# Patient Record
Sex: Male | Born: 1995 | Race: Black or African American | Hispanic: No | Marital: Single | Smoking: Never smoker
Health system: Southern US, Community
[De-identification: ages and names within clinical notes are randomized; demographics above are authoritative.]

## PROBLEM LIST (undated history)

## (undated) DIAGNOSIS — G43909 Migraine, unspecified, not intractable, without status migrainosus: Secondary | ICD-10-CM

## (undated) HISTORY — DX: Migraine, unspecified, not intractable, without status migrainosus: G43.909

## (undated) HISTORY — PX: EYE SURGERY: SHX253

---
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
## 2014-04-10 ENCOUNTER — Ambulatory Visit (INDEPENDENT_AMBULATORY_CARE_PROVIDER_SITE_OTHER): Payer: BC Managed Care – PPO | Admitting: Emergency Medicine

## 2014-04-10 VITALS — BP 112/62 | HR 56 | Temp 97.8°F | Resp 16 | Ht 70.75 in | Wt 194.2 lb

## 2014-04-10 DIAGNOSIS — S335XXA Sprain of ligaments of lumbar spine, initial encounter: Secondary | ICD-10-CM

## 2014-04-10 DIAGNOSIS — S39012A Strain of muscle, fascia and tendon of lower back, initial encounter: Secondary | ICD-10-CM

## 2014-04-10 MED ORDER — NAPROXEN SODIUM 550 MG PO TABS: 550.0000 mg | ORAL_TABLET | Freq: Two times a day (BID) | ORAL | Status: AC

## 2014-04-10 NOTE — Patient Instructions (Signed)
Lumbosacral Strain Lumbosacral strain is a strain of any of the parts that make up your lumbosacral vertebrae. Your lumbosacral vertebrae are the bones that make up the lower third of your backbone. Your lumbosacral vertebrae are held together by muscles and tough, fibrous tissue (ligaments).  CAUSES  A sudden blow to your back can cause lumbosacral strain. Also, anything that causes an excessive stretch of the muscles in the low back can cause this strain. This is typically seen when people exert themselves strenuously, fall, lift heavy objects, bend, or crouch repeatedly. RISK FACTORS  Physically demanding work.  Participation in pushing or pulling sports or sports that require sudden twist of the back (tennis, golf, baseball).  Weight lifting.  Excessive lower back curvature.  Forward-tilted pelvis.  Weak back or abdominal muscles or both.  Tight hamstrings. SIGNS AND SYMPTOMS  Lumbosacral strain may cause pain in the area of your injury or pain that moves (radiates) down your leg.  DIAGNOSIS Your health care provider can often diagnose lumbosacral strain through a physical exam. In some cases, you may need tests such as X-ray exams.  TREATMENT  Treatment for your lower back injury depends on many factors that your clinician will have to evaluate. However, most treatment will include the use of anti-inflammatory medicines. HOME CARE INSTRUCTIONS   Avoid hard physical activities (tennis, racquetball, waterskiing) if you are not in proper physical condition for it. This may aggravate or create problems.  If you have a back problem, avoid sports requiring sudden body movements. Swimming and walking are generally safer activities.  Maintain good posture.  Maintain a healthy weight.  For acute conditions, you may put ice on the injured area.  Put ice in a plastic bag.  Place a towel between your skin and the bag.  Leave the ice on for 20 minutes, 2 3 times a day.  When the  low back starts healing, stretching and strengthening exercises may be recommended. SEEK MEDICAL CARE IF:  Your back pain is getting worse.  You experience severe back pain not relieved with medicines. SEEK IMMEDIATE MEDICAL CARE IF:   You have numbness, tingling, weakness, or problems with the use of your arms or legs.  There is a change in bowel or bladder control.  You have increasing pain in any area of the body, including your belly (abdomen).  You notice shortness of breath, dizziness, or feel faint.  You feel sick to your stomach (nauseous), are throwing up (vomiting), or become sweaty.  You notice discoloration of your toes or legs, or your feet get very cold. MAKE SURE YOU:   Understand these instructions.  Will watch your condition.  Will get help right away if you are not doing well or get worse. Document Released: 08/06/2005 Document Revised: 08/17/2013 Document Reviewed: 06/15/2013 ExitCare Patient Information 2014 [employer].  

## 2014-04-10 NOTE — Progress Notes (Signed)
[facility] [address] [place] [address] [phone]  Date:  04/10/2014   Name:  Kenneth Browning   DOB:  Mar 03, 1996   MRN:  [id number]  PCP:  Michiel Sites, MD    Chief Complaint: Back Pain   History of Present Illness:  Kenneth Browning is a 18 y.o. very pleasant male patient who presents with the following:  Recurrent back pain in low back since a fall while playing basketball in middle school.  Says had low back pain last night that forced him to go home from work early. No history of re-injury or overuse.  No medications.  No radiation of pain or neurologic symptoms.  No improvement with over the counter medications or other home remedies. Denies other complaint or health concern today.   There are no active problems to display for this patient.   No past medical history on file.  Past Surgical History  Procedure Laterality Date  . Eye surgery      LT eye surgery    History  Substance Use Topics  . Smoking status: Never Smoker   . Smokeless tobacco: Not on file  . Alcohol Use: No    Family History  Problem Relation Age of Onset  . Cancer Maternal Grandfather     No Known Allergies  Medication list has been reviewed and updated.  No current outpatient prescriptions on file prior to visit.   No current facility-administered medications on file prior to visit.    Review of Systems:  As per HPI, otherwise negative.    Physical Examination: Filed Vitals:   04/10/14 1034  BP: 112/62  Pulse: 56  Temp: 97.8 F (36.6 C)  Resp: 16   Filed Vitals:   04/10/14 1034  Height: 5' 10.75" (1.797 m)  Weight: 194 lb 3.2 oz (88.089 kg)   Body mass index is 27.28 kg/(m^2). Ideal Body Weight: Weight in (lb) to have BMI = 25: 177.6   GEN: WDWN, NAD, Non-toxic, Alert & Oriented x 3 HEENT: Atraumatic, Normocephalic.  Ears and Nose: No external deformity. EXTR: No clubbing/cyanosis/edema NEURO: Normal gait.  PSYCH: Normally  interactive. Conversant. Not depressed or anxious appearing.  Calm demeanor.  Back: mild right lumbar tenderness.  Motor intact.  Neuro intact  Assessment and Plan: Lumbar strain Anaprox   Signed,  Phillips Odor, MD

## 2014-11-30 ENCOUNTER — Ambulatory Visit (INDEPENDENT_AMBULATORY_CARE_PROVIDER_SITE_OTHER): Payer: BLUE CROSS/BLUE SHIELD

## 2014-11-30 ENCOUNTER — Telehealth: Payer: Self-pay | Admitting: Internal Medicine

## 2014-11-30 ENCOUNTER — Ambulatory Visit (INDEPENDENT_AMBULATORY_CARE_PROVIDER_SITE_OTHER): Payer: BLUE CROSS/BLUE SHIELD | Admitting: Family Medicine

## 2014-11-30 VITALS — BP 102/60 | HR 65 | Temp 98.7°F | Resp 18 | Ht 73.5 in | Wt 195.0 lb

## 2014-11-30 DIAGNOSIS — Z23 Encounter for immunization: Secondary | ICD-10-CM

## 2014-11-30 DIAGNOSIS — M25571 Pain in right ankle and joints of right foot: Secondary | ICD-10-CM

## 2014-11-30 DIAGNOSIS — S93401A Sprain of unspecified ligament of right ankle, initial encounter: Secondary | ICD-10-CM

## 2014-11-30 MED ORDER — NAPROXEN 500 MG PO TABS: 500.0000 mg | ORAL_TABLET | Freq: Two times a day (BID) | ORAL | Status: DC

## 2014-11-30 NOTE — Telephone Encounter (Signed)
Left message for the patient to call and reschedule New Patient  appointment.  Thank you, Katrina Webb [facility] [phone] Fax [phone]

## 2014-11-30 NOTE — Progress Notes (Addendum)
Subjective:   This chart was scribed for Ethelda ChickKristi M Smith, MD by Arlan OrganAshley Leger, [facility] Sc[facility]. This patient was seen in room 14 and the patient's care was started 5:49 PM.    Patient ID: Kenneth Browning, male    DOB: [birth year], 19 y.o.   MRN: [id number]  11/30/2014  Ankle Pain and Knee Pain   HPI  HPI Comments: Kenneth Browning is a 19 y.o. male who [facility] complaining of constant, moderate R ankle pain with associated swelling and R knee pain onset 7 PM yesterday evening.  Pain has progressively worsened since yesterday evening. Pt was playing basketball last night when he was hit from behind and felt his ankle roll. Kenneth Browning is able to bear weight to his R foot with pain. Pt presents with a brace to the R foot. No OTC medications taken for pain. He has tried ice application with mild improvement for symptoms. However, he states discomfort worsened in the middle of the night last night and woke him from sleep. 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   Review of Systems  Constitutional: Negative for fever, chills, diaphoresis and fatigue.  Cardiovascular: Negative for leg swelling.  Musculoskeletal: Positive for joint swelling, arthralgias and gait problem.  Neurological: Negative for weakness and numbness.    History reviewed. No pertinent past medical history. Past Surgical History  Procedure Laterality Date   Eye surgery      LT eye surgery   No Known Allergies      Objective:    BP 102/60 mmHg   Pulse 65   Temp(Src) 98.7 F (37.1 C) (Oral)   Resp 18   Ht 6' 1.5" (1.867 m)   Wt 195 lb (88.451 kg)   BMI 25.38 kg/m2   SpO2 100% Physical Exam  Constitutional: He is oriented to person, place, and time. He appears well-developed and well-nourished. No distress.  HENT:  Head: Normocephalic.  Eyes: EOM are normal.  Neck: Normal range of motion.  Pulmonary/Chest: Effort normal.    Abdominal: He exhibits no distension.  Musculoskeletal: He exhibits tenderness.       Right knee: He exhibits bony tenderness. He exhibits normal range of motion, no swelling, no laceration, no erythema and normal meniscus. No tenderness found. No medial joint line, no lateral joint line and no patellar tendon tenderness noted.       Right ankle: He exhibits decreased range of motion and swelling. He exhibits no ecchymosis, no laceration and normal pulse. Tenderness. Lateral malleolus tenderness found. No medial malleolus, no head of 5th metatarsal and no proximal fibula tenderness found. Achilles tendon normal. Achilles tendon exhibits no pain, no defect and normal Thompson's test results.       Right foot: Normal. There is normal range of motion, no tenderness, no bony tenderness and no swelling.  No patellar tenderness on R knee No medial or lateral joint line tenderness No tibial joint line tenderness No pain with calf squeeze Full extension and flexion of R knee Tenderness to palpation on R lateral malleolus with swelling Pain inferior to lateral malleolus  Pain with dorsal flexion less with plantar flexion Anterior drawer test is normal No calf swelling  Neurological: He is alert and oriented to person, place, and time.  Skin: He is not diaphoretic.  Psychiatric: He has a normal mood and affect.  Nursing note and vitals reviewed.  [facility] reading (PRIMARY) by  Dr.  Smith.  R ANKLE:  NAD   INFLUENZA VACCINE ADMINISTERED.    Assessment & Plan:   1. Right ankle pain   2. Need for prophylactic vaccination and inoculation against influenza   3. Right ankle sprain, initial encounter      1. R ankle pain/strain lateral:  New.  Recommend rest, ice, elevation; continue ankle brace with activity for two weeks; no exercise for one week. If no improvement in one week, RTC.   2.  S/p flu vaccine in office.   Meds ordered this encounter  Medications   naproxen (NAPROSYN) 500 MG tablet     Sig: Take 1 tablet (500 mg total) by mouth 2 (two) times daily with a meal.    Dispense:  40 tablet    Refill:  0    No Follow-up on file.   I personally performed the services described in this documentation, which was scribed in my presence. The recorded information has been reviewed and considered.  Kristi Paulita Fujita, M.D. [facility] [address] [place], [place]  [address] [phone] phone [phone] fax

## 2014-11-30 NOTE — Patient Instructions (Signed)

## 2014-12-01 ENCOUNTER — Ambulatory Visit: Payer: Self-pay | Admitting: Physician Assistant

## 2014-12-08 ENCOUNTER — Ambulatory Visit: Payer: Self-pay | Admitting: Physician Assistant

## 2014-12-15 ENCOUNTER — Ambulatory Visit: Payer: Self-pay | Admitting: Physician Assistant

## 2014-12-29 ENCOUNTER — Ambulatory Visit (INDEPENDENT_AMBULATORY_CARE_PROVIDER_SITE_OTHER): Payer: BLUE CROSS/BLUE SHIELD | Admitting: Physician Assistant

## 2014-12-29 ENCOUNTER — Ambulatory Visit: Payer: Self-pay | Admitting: Physician Assistant

## 2014-12-29 ENCOUNTER — Encounter: Payer: Self-pay | Admitting: Physician Assistant

## 2014-12-29 VITALS — BP 120/70 | HR 56 | Temp 98.1°F | Resp 16 | Ht 73.25 in | Wt 195.0 lb

## 2014-12-29 DIAGNOSIS — E559 Vitamin D deficiency, unspecified: Secondary | ICD-10-CM

## 2014-12-29 DIAGNOSIS — R5383 Other fatigue: Secondary | ICD-10-CM

## 2014-12-29 DIAGNOSIS — Z79899 Other long term (current) drug therapy: Secondary | ICD-10-CM

## 2014-12-29 DIAGNOSIS — Z0001 Encounter for general adult medical examination with abnormal findings: Secondary | ICD-10-CM

## 2014-12-29 DIAGNOSIS — R6889 Other general symptoms and signs: Secondary | ICD-10-CM

## 2014-12-29 DIAGNOSIS — G43009 Migraine without aura, not intractable, without status migrainosus: Secondary | ICD-10-CM

## 2014-12-29 DIAGNOSIS — G43909 Migraine, unspecified, not intractable, without status migrainosus: Secondary | ICD-10-CM | POA: Insufficient documentation

## 2014-12-29 LAB — CBC WITH DIFFERENTIAL/PLATELET
BASOS PCT: 2 % — AB (ref 0–1)
Basophils Absolute: 0.1 10*3/uL (ref 0.0–0.1)
EOS ABS: 0.2 10*3/uL (ref 0.0–0.7)
EOS PCT: 6 % — AB (ref 0–5)
HEMATOCRIT: 41.3 % (ref 39.0–52.0)
HEMOGLOBIN: 14.4 g/dL (ref 13.0–17.0)
LYMPHS ABS: 1.5 10*3/uL (ref 0.7–4.0)
Lymphocytes Relative: 44 % (ref 12–46)
MCH: 28.9 pg (ref 26.0–34.0)
MCHC: 34.9 g/dL (ref 30.0–36.0)
MCV: 82.9 fL (ref 78.0–100.0)
MONO ABS: 0.5 10*3/uL (ref 0.1–1.0)
MONOS PCT: 13 % — AB (ref 3–12)
MPV: 9.9 fL (ref 8.6–12.4)
Neutro Abs: 1.2 10*3/uL — ABNORMAL LOW (ref 1.7–7.7)
Neutrophils Relative %: 35 % — ABNORMAL LOW (ref 43–77)
Platelets: 187 10*3/uL (ref 150–400)
RBC: 4.98 MIL/uL (ref 4.22–5.81)
RDW: 12.7 % (ref 11.5–15.5)
WBC: 3.5 10*3/uL — ABNORMAL LOW (ref 4.0–10.5)

## 2014-12-29 LAB — BASIC METABOLIC PANEL WITH GFR
BUN: 20 mg/dL (ref 6–23)
CALCIUM: 9.1 mg/dL (ref 8.4–10.5)
CO2: 30 mEq/L (ref 19–32)
CREATININE: 1.08 mg/dL (ref 0.50–1.35)
Chloride: 104 mEq/L (ref 96–112)
GFR, Est Non African American: >89 mL/min
GLUCOSE: 88 mg/dL (ref 70–99)
Potassium: 4 mEq/L (ref 3.5–5.3)
SODIUM: 139 meq/L (ref 135–145)

## 2014-12-29 LAB — HEPATIC FUNCTION PANEL
ALK PHOS: 68 U/L (ref 39–117)
ALT: 29 U/L (ref 0–53)
AST: 37 U/L (ref 0–37)
Albumin: 4.3 g/dL (ref 3.5–5.2)
BILIRUBIN DIRECT: 0.2 mg/dL (ref 0.0–0.3)
BILIRUBIN INDIRECT: 0.5 mg/dL (ref 0.2–1.1)
Total Bilirubin: 0.7 mg/dL (ref 0.2–1.1)
Total Protein: 7.3 g/dL (ref 6.0–8.3)

## 2014-12-29 LAB — LIPID PANEL
CHOLESTEROL: 100 mg/dL (ref 0–200)
HDL: 61 mg/dL (ref >39)
LDL CALC: 35 mg/dL (ref 0–99)
TRIGLYCERIDES: 19 mg/dL (ref <150)
Total CHOL/HDL Ratio: 1.6 Ratio
VLDL: 4 mg/dL (ref 0–40)

## 2014-12-29 LAB — MAGNESIUM: MAGNESIUM: 2 mg/dL (ref 1.5–2.5)

## 2014-12-29 NOTE — Patient Instructions (Signed)
Find out about tetanus shot and HPV vaccine  HPV Vaccine Cervarix (Human Papillomavirus): What You Need to Know 1. What is HPV? Genital human papillomavirus (HPV) is the most common sexually transmitted virus in the [place]. More than half of sexually active men and women are infected with HPV at some time in their lives. About 20 million Americans are currently infected, and about 6 million more get infected each year. HPV is usually spread through sexual contact. Most HPV infections don't cause any symptoms, and go away on their own. But HPV can cause cervical cancer in women. Cervical cancer is the 2nd leading cause of cancer deaths among women around the world. In the [place], about 10,000 women get cervical cancer every year and about 4,000 are expected to die from it. HPV is also associated with several less common cancers, such as vaginal and vulvar cancers in women and other types of cancer in both men and women. It can also cause genital warts and warts in the throat. There is no cure for HPV infection, but some of the problems it causes can be treated. 2. HPV vaccine: Why get vaccinated? HPV vaccine is important because it can prevent most cases of cervical cancer in females, if it is given before a person is exposed to the virus. Protection from HPV vaccine is expected to be long-lasting. But vaccination is not a substitute for cervical cancer screening. Women should still get regular Pap tests. The vaccine you are getting is one of two HPV vaccines that can be given to prevent cervical cancer. It is given to females only. The other vaccine may be given to both males and females. It can also prevent most genital warts. It has also been shown to prevent some vaginal, vulvar and anal cancers. 3. Who should get this HPV vaccine and when? Routine vaccination  HPV vaccine is recommended for girls 23 or 20 years of age. It may be given to girls starting at age 62. Why is HPV  vaccine given to girls at this age? It is important for girls to get HPV vaccine before their first sexual contact--because they won't have been exposed to human papillomavirus. Once a girl or woman has been infected with the virus, the vaccine might not work as well or might not work at all. Catch-up vaccination  The vaccine is also recommended for girls and women 64 through 20 years of age who did not get all 3 doses when they were younger. HPV vaccine is given as a 3-dose series  1st Dose: Now  2nd Dose: 1 to 2 months after Dose 1  3rd Dose: 6 months after Dose 1 Additional (booster) doses are not recommended. HPV vaccine may be given at the same time as other vaccines. 4. Some people should not get HPV vaccine or should wait  Anyone who has ever had a life-threatening allergic reaction to any component of HPV vaccine, or to a previous dose of HPV vaccine, should not get the vaccine. Tell your doctor if the person getting vaccinated has any severe allergies, including an allergy to latex.  HPV vaccine is not recommended for pregnant women. However, receiving HPV vaccine when pregnant is not a reason to consider terminating the pregnancy. Women who are breast feeding may get the vaccine.  Any woman who learns she was pregnant when she got this HPV vaccine is encouraged to contact the manufacturer's HPV in pregnancy registry at (539)592-1585. This will help [place] learn how pregnant women respond  to the vaccine.  People who are mildly ill when a dose of HPV is planned can still be vaccinated. People with a moderate or severe illness should wait until they are better. 5. What are the risks from this vaccine? This HPV vaccine has been in use around the world for several years and has been very safe. However, any medicine could possibly cause a serious problem, such as a severe allergic reaction. The risk of any vaccine causing a serious injury, or death, is extremely small. Life-threatening  allergic reactions from vaccines are very rare. If they do occur, it would be within a few minutes to a few hours after the vaccination. Several mild to moderate problems are known to occur with HPV vaccine. These do not last long and go away on their own.  Reactions where the shot was given:  Pain (about 9 people in 10)  Redness or swelling (about 1 person in 2)  Other mild reactions:  Fever of 99.75F or higher (about 1 person in 8)  Headache or fatigue (about 1 person in 2)  Nausea, vomiting, diarrhea, or abdominal pain (about 1 person in 4)  Muscle or joint pain (up to 1 person in 2)  Fainting:  Brief fainting spells and related symptoms (such as jerking movements) can happen after any medical procedure, including vaccination. Sitting or lying down for about 15 minutes after a vaccination can help prevent fainting and injuries caused by falls. Tell your doctor if the patient feels dizzy or light-headed, or has vision changes or ringing in the ears.  Like all vaccines, HPV vaccines will continue to be monitored for unusual or severe problems. 6. What if there is a serious reaction? What should I look for?  Look for anything that concerns you, such as signs of a severe allergic reaction, very high fever, or behavior changes. Signs of a severe allergic reaction can include hives, swelling of the face and throat, difficulty breathing, a fast heartbeat, dizziness, and weakness. These would start a few minutes to a few hours after the vaccination.  What should I do?  If you think it is a severe allergic reaction or other emergency that can't wait, call 9-1-1 or get the person to the nearest hospital. Otherwise, call your doctor.  Afterward, the reaction should be reported to the Vaccine Adverse Event Reporting System (VAERS). Your doctor might file this report, or you can do it yourself through the VAERS web site at www.vaers.LAgents.nohhs.gov, or by calling 1-3511089081. VAERS is only for  reporting reactions. They do not give medical advice. 7. The National Vaccine Injury Compensation Program The [employer] Vaccine Injury Compensation Program (VICP) is a federal program that was created to compensate people who may have been injured by certain vaccines. Persons who believe they may have been injured by a vaccine can learn about the program and about filing a claim by calling 1-431-041-8966 or visiting the VICP website at SpiritualWord.atwww.hrsa.gov/vaccinecompensation. 8. How can I learn more?  Ask your doctor.  Call your local or state health department.  Contact the Centers for Disease Control and Prevention (CDC):  Call 81344599121-289-828-9252 (1-800-CDC-INFO) or  Visit CDC's website at  PicCapture.uywww.cdc.gov/vaccines CDC Human Papillomavirus (HPV) Vaccine Cervarix VIS (03/12/10) Document Released: 03/15/2009 Document Revised: 03/13/2014 Document Reviewed: 12/08/2013 [facility] Patient Information 2015 [place] [place]. This information is not intended to replace advice given to you by your health care provider. Make sure you discuss any questions you have with your health care provider.  What is the TMJ?  The temporomandibular (tem-PUH-ro-man-DIB-yoo-ler) joint, or the TMJ, connects the upper and lower jawbones. This joint allows the jaw to open wide and move back and forth when you chew, talk, or yawn.There are also several muscles that help this joint move. There can be muscle tightness and pain in the muscle that can cause several symptoms.  What causes TMJ pain? There are many causes of TMJ pain. Repeated chewing (for example, chewing gum) and clenching your teeth can cause pain in the joint. Some TMJ pain has no obvious cause. What can I do to ease the pain? There are many things you can do to help your pain get better. When you have pain:  Eat soft foods and stay away from chewy foods (for example, taffy) Try to use both sides of your mouth to chew Don't chew gum Massage Don't open your mouth wide  (for example, during yawning or singing) Don't bite your cheeks or fingernails Lower your amount of stress and worry Applying a warm, damp washcloth to the joint may help. Over-the-counter pain medicines such as ibuprofen (one brand: Advil) or acetaminophen (one brand: Tylenol) might also help. Do not use these medicines if you are allergic to them or if your doctor told you not to use them. How can I stop the pain from coming back? When your pain is better, you can do these exercises to make your muscles stronger and to keep the pain from coming back:  Resisted mouth opening: Place your thumb or two fingers under your chin and open your mouth slowly, pushing up lightly on your chin with your thumb. Hold for three to six seconds. Close your mouth slowly. Resisted mouth closing: Place your thumbs under your chin and your two index fingers on the ridge between your mouth and the bottom of your chin. Push down lightly on your chin as you close your mouth. Tongue up: Slowly open and close your mouth while keeping the tongue touching the roof of the mouth. Side-to-side jaw movement: Place an object about one fourth of an inch thick (for example, two tongue depressors) between your front teeth. Slowly move your jaw from side to side. Increase the thickness of the object as the exercise becomes easier Forward jaw movement: Place an object about one fourth of an inch thick between your front teeth and move the bottom jaw forward so that the bottom teeth are in front of the top teeth. Increase the thickness of the object as the exercise becomes easier. These exercises should not be painful. If it hurts to do these exercises, stop doing them and talk to your family doctor.

## 2014-12-29 NOTE — Progress Notes (Addendum)
Complete Physical  Assessment and Plan: 1. Migraine without aura and without status migrainosus, not intractable ? TMJ- given information about both, treat PRN, if becomes more frequent will treat  2. Vitamin D deficiency - Vit D  25 hydroxy (rtn osteoporosis monitoring)  3. Medication management - Magnesium  4. Other fatigue - CBC with Differential/Platelet - BASIC METABOLIC PANEL WITH GFR - Hepatic function panel - Lipid panel - TSH - EKG 12-Lead  5. Encounter for general adult medical examination with abnormal findings Discussed STD testing, safe sex, alcohol and drug awareness, drinking and driving dangers, wearing a seat belt and general safety measures for young adult.   Discussed med's effects and SE's. Screening labs and tests as requested with regular follow-up as recommended.  Addendum: CBC slightly abnormal but could be normal cyclic variant, suggest getting repeat CBC in 1-2 months with Vitamin D lab only. Will start on vitamin D RX due to severe deficiency.   HPI Patient presents for a complete physical.   His blood pressure has been controlled at home, today their BP is BP: 120/70 mmHg He does workout and play basketball. He denies chest pain, shortness of breath, dizziness.  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 He does not know what he would like to do at this time. Has had migraines for 2-3 years, with photosensitvity, N/V, no aura, happens 2-3 times a month and sleeps it off. Does not miss much school/work due to it. Happens in AM. Occ fatigue but states works very hard at school and work.   Current Medications:  Current Outpatient Prescriptions on File Prior to Visit  Medication Sig Dispense Refill  . naproxen (NAPROSYN) 500 MG tablet Take 1 tablet (500 mg total) by mouth 2 (two) times daily with a meal. 40 tablet 0  . naproxen sodium (ANAPROX DS) 550 MG tablet Take 1 tablet (550 mg total) by mouth 2 (two) times daily with a meal. 60  tablet 0   No current facility-administered medications on file prior to visit.   Health Maintenance:  Immunization History  Administered Date(s) Administered  . Influenza,inj,Quad PF,36+ Mos 11/30/2014   Tetanus: 2014 Pneumovax: N/A Prevnar 13: N/A Flu vaccine: 2016 Zostavax: N/A Gaurdisil- unknown DEXA: N/A Colonoscopy: N/A EGD: N/A Eye Exam: [facility] Dentist: can not remember name, does not recall name  Patient Care Team: Lucky CowboyWilliam McKeown, MD as PCP - General (Internal Medicine)  Allergies: No Known Allergies Medical History:  Past Medical History  Diagnosis Date  . Migraine    Surgical History:  Past Surgical History  Procedure Laterality Date  . Eye surgery      LT eye surgery   Family History:  Family History  Problem Relation Age of Onset  . Cancer Maternal Grandfather   . Hypertension Mother    Social History:   History  Substance Use Topics  . Smoking status: Never Smoker   . Smokeless tobacco: Never Used  . Alcohol Use: No     Comment: socially   Review of Systems:  Review of Systems  Constitutional: Positive for malaise/fatigue. Negative for fever, chills, weight loss and diaphoresis.  HENT: Negative for congestion, ear discharge, ear pain, hearing loss, nosebleeds, sore throat and tinnitus.   Respiratory: Negative.  Negative for stridor.   Cardiovascular: Negative.   Gastrointestinal: Negative.   Genitourinary: Negative.   Musculoskeletal: Negative.   Skin: Negative.   Neurological: Positive for loss of consciousness and headaches. Negative for dizziness, tingling,  tremors, sensory change, speech change, focal weakness, seizures and weakness.  Psychiatric/Behavioral: Negative.  Negative for depression, suicidal ideas and substance abuse.    Physical Exam: Estimated body mass index is 25.54 kg/(m^2) as calculated from the following:   Height as of this encounter: 6' 1.25" (1.861 m).   Weight as of this encounter: 195 lb (88.451 kg). BP  120/70 mmHg  Pulse 56  Temp(Src) 98.1 F (36.7 C)  Resp 16  Ht 6' 1.25" (1.861 m)  Wt 195 lb (88.451 kg)  BMI 25.54 kg/m2 General Appearance: Well nourished, in no apparent distress.  Eyes: PERRLA, EOMs, conjunctiva no swelling or erythema, normal fundi and vessels.  Sinuses: No Frontal/maxillary tenderness  ENT/Mouth: Ext aud canals clear, normal light reflex with TMs without erythema, bulging. Good dentition. No erythema, swelling, or exudate on post pharynx. Tonsils not swollen or erythematous. Hearing normal.  Neck: Supple, thyroid normal. No bruits  Respiratory: Respiratory effort normal, BS equal bilaterally without rales, rhonchi, wheezing or stridor.  Cardio: RRR without murmurs, rubs or gallops. Brisk peripheral pulses without edema.  Chest: symmetric, with normal excursions and percussion.  Abdomen: Soft, nontender, flat, no guarding, rebound, hernias, masses, or organomegaly.  Lymphatics: Non tender without lymphadenopathy.  Genitourinary: defer Musculoskeletal: Full ROM all peripheral extremities,5/5 strength, and normal gait.  Skin: Warm, dry without rashes, lesions, ecchymosis. Neuro: Cranial nerves intact, reflexes equal bilaterally. Normal muscle tone, no cerebellar symptoms. Sensation intact.  Psych: Awake and oriented X 3, normal affect, Insight and Judgment appropriate.   EKG: WNL   Quentin Mulling 12:21 PM [facility]

## 2014-12-30 LAB — TSH: TSH: 1.77 u[IU]/mL (ref 0.350–4.500)

## 2014-12-30 LAB — VITAMIN D 25 HYDROXY (VIT D DEFICIENCY, FRACTURES): Vit D, 25-Hydroxy: 8 ng/mL — ABNORMAL LOW (ref 30–100)

## 2014-12-30 MED ORDER — VITAMIN D (ERGOCALCIFEROL) 1.25 MG (50000 UNIT) PO CAPS: 50000.0000 [IU] | ORAL_CAPSULE | ORAL | Status: DC

## 2014-12-30 NOTE — Addendum Note (Signed)
Addended by: Doree AlbeeOLLIER, AMANDA R on: 12/30/2014 06:57 PM   Modules accepted: Orders, SmartSet

## 2015-01-05 ENCOUNTER — Emergency Department (HOSPITAL_BASED_OUTPATIENT_CLINIC_OR_DEPARTMENT_OTHER)
Admission: EM | Admit: 2015-01-05 | Discharge: 2015-01-05 | Disposition: A | Discharge status: Home / Self Care | Payer: BLUE CROSS/BLUE SHIELD | Attending: Emergency Medicine | Admitting: Emergency Medicine

## 2015-01-05 ENCOUNTER — Encounter (HOSPITAL_BASED_OUTPATIENT_CLINIC_OR_DEPARTMENT_OTHER): Payer: Self-pay | Admitting: Emergency Medicine

## 2015-01-05 DIAGNOSIS — Y92410 Unspecified street and highway as the place of occurrence of the external cause: Secondary | ICD-10-CM | POA: Diagnosis not present

## 2015-01-05 DIAGNOSIS — Y9389 Activity, other specified: Secondary | ICD-10-CM | POA: Insufficient documentation

## 2015-01-05 DIAGNOSIS — Y998 Other external cause status: Secondary | ICD-10-CM | POA: Diagnosis not present

## 2015-01-05 DIAGNOSIS — S39012A Strain of muscle, fascia and tendon of lower back, initial encounter: Secondary | ICD-10-CM | POA: Diagnosis not present

## 2015-01-05 DIAGNOSIS — Z8679 Personal history of other diseases of the circulatory system: Secondary | ICD-10-CM | POA: Diagnosis not present

## 2015-01-05 DIAGNOSIS — Z791 Long term (current) use of non-steroidal anti-inflammatories (NSAID): Secondary | ICD-10-CM | POA: Insufficient documentation

## 2015-01-05 DIAGNOSIS — S3992XA Unspecified injury of lower back, initial encounter: Secondary | ICD-10-CM | POA: Diagnosis present

## 2015-01-05 NOTE — ED Notes (Signed)
EDP at patient bedside for assessment/update  
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
## 2015-01-05 NOTE — Discharge Instructions (Signed)
Take the Naprosyn as you have been 500 mg every 12 hours. Return for development of any abdominal pain or persistent nausea and vomiting. Follow-up with your doctor at the back is not improved over the next several days.
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
## 2015-01-28 ENCOUNTER — Emergency Department (HOSPITAL_BASED_OUTPATIENT_CLINIC_OR_DEPARTMENT_OTHER): Payer: BLUE CROSS/BLUE SHIELD

## 2015-01-28 ENCOUNTER — Encounter (HOSPITAL_BASED_OUTPATIENT_CLINIC_OR_DEPARTMENT_OTHER): Payer: Self-pay | Admitting: *Deleted

## 2015-01-28 DIAGNOSIS — Z791 Long term (current) use of non-steroidal anti-inflammatories (NSAID): Secondary | ICD-10-CM | POA: Diagnosis not present

## 2015-01-28 DIAGNOSIS — Z8669 Personal history of other diseases of the nervous system and sense organs: Secondary | ICD-10-CM | POA: Insufficient documentation

## 2015-01-28 DIAGNOSIS — Y9389 Activity, other specified: Secondary | ICD-10-CM | POA: Insufficient documentation

## 2015-01-28 DIAGNOSIS — S93602A Unspecified sprain of left foot, initial encounter: Secondary | ICD-10-CM | POA: Diagnosis not present

## 2015-01-28 DIAGNOSIS — S93402A Sprain of unspecified ligament of left ankle, initial encounter: Secondary | ICD-10-CM | POA: Insufficient documentation

## 2015-01-28 DIAGNOSIS — Y998 Other external cause status: Secondary | ICD-10-CM | POA: Insufficient documentation

## 2015-01-28 DIAGNOSIS — Y92410 Unspecified street and highway as the place of occurrence of the external cause: Secondary | ICD-10-CM | POA: Diagnosis not present

## 2015-01-28 DIAGNOSIS — S99912A Unspecified injury of left ankle, initial encounter: Secondary | ICD-10-CM | POA: Diagnosis present
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
## 2015-01-29 ENCOUNTER — Emergency Department (HOSPITAL_BASED_OUTPATIENT_CLINIC_OR_DEPARTMENT_OTHER)
Admission: EM | Admit: 2015-01-29 | Discharge: 2015-01-29 | Disposition: A | Discharge status: Home / Self Care | Payer: BLUE CROSS/BLUE SHIELD | Attending: Emergency Medicine | Admitting: Emergency Medicine

## 2015-01-29 DIAGNOSIS — S93602A Unspecified sprain of left foot, initial encounter: Secondary | ICD-10-CM

## 2015-01-29 DIAGNOSIS — S93402A Sprain of unspecified ligament of left ankle, initial encounter: Secondary | ICD-10-CM

## 2015-01-29 NOTE — ED Notes (Signed)
Pt not in room to receive discharge papers assumed to have left the building
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
## 2016-01-02 ENCOUNTER — Encounter: Payer: Self-pay | Admitting: Physician Assistant

## 2017-11-17 ENCOUNTER — Emergency Department (HOSPITAL_BASED_OUTPATIENT_CLINIC_OR_DEPARTMENT_OTHER)
Admission: EM | Admit: 2017-11-17 | Discharge: 2017-11-18 | Disposition: A | Discharge status: Home / Self Care | Payer: 59 | Attending: Emergency Medicine | Admitting: Emergency Medicine

## 2017-11-17 ENCOUNTER — Encounter (HOSPITAL_COMMUNITY): Payer: Self-pay | Admitting: Emergency Medicine

## 2017-11-17 ENCOUNTER — Other Ambulatory Visit: Payer: Self-pay

## 2017-11-17 ENCOUNTER — Ambulatory Visit (HOSPITAL_COMMUNITY)
Admission: EM | Admit: 2017-11-17 | Discharge: 2017-11-17 | Disposition: A | Discharge status: Home / Self Care | Payer: 59 | Attending: Internal Medicine | Admitting: Internal Medicine

## 2017-11-17 ENCOUNTER — Encounter (HOSPITAL_BASED_OUTPATIENT_CLINIC_OR_DEPARTMENT_OTHER): Payer: Self-pay

## 2017-11-17 DIAGNOSIS — L02214 Cutaneous abscess of groin: Secondary | ICD-10-CM | POA: Diagnosis not present

## 2017-11-17 DIAGNOSIS — R1909 Other intra-abdominal and pelvic swelling, mass and lump: Secondary | ICD-10-CM

## 2017-11-17 MED ORDER — ACETAMINOPHEN 500 MG PO TABS
1000.0000 mg | ORAL_TABLET | Freq: Once | ORAL | Status: AC
  Administered 2017-11-17: 1000 mg via ORAL
  Filled 2017-11-17: qty 2

## 2017-11-17 NOTE — ED Triage Notes (Signed)
PT reports he believes he has a new right inguinal hernia. No history of same. PT noticed it a week ago.

## 2017-11-17 NOTE — ED Triage Notes (Signed)
[facility]D-steady gait

## 2017-11-17 NOTE — ED Provider Notes (Signed)
[facility] EMERGENCY DEPARTMENT Provider Note   CSN: [id number] Arrival date & time: 11/17/17  2058     History   Chief Complaint Chief Complaint  Patient presents with  . Abscess    HPI Kenneth Browning is a 22 y.o. male resents for evaluation of right groin swelling that was ongoing for the past 1 week.  Patient reports that area has worsened in pain and size.  He states that he has noted some drainage to the area at home.  He was evaluated by urgent care today and was prompted to go to the emergency department for further evaluation.  He denies any fevers.  He states that he is currently sexually active with one partner and that they do use protection.  He does not of the STD history of his partner.  Patient denies any testicular pain or swelling, penile pain or swelling, penile discharge, dysuria, hematuria.  The history is provided by the patient.    Past Medical History:  Diagnosis Date  . Migraine     Patient Active Problem List   Diagnosis Date Noted  . Migraine 12/29/2014    Past Surgical History:  Procedure Laterality Date  . EYE SURGERY     LT eye surgery       Home Medications    Prior to Admission medications   Medication Sig Start Date End Date Taking? Authorizing Provider  doxycycline (VIBRAMYCIN) 100 MG capsule Take 1 capsule (100 mg total) by mouth 2 (two) times daily for 7 days. 11/18/17 11/25/17  Maxwell CaulLayden, Ziomara Birenbaum A, PA-C    Family History Family History  Problem Relation Age of Onset  . Cancer Maternal Grandfather   . Hypertension Mother     Social History Social History   Tobacco Use  . Smoking status: Never Smoker  . Smokeless tobacco: Never Used  Substance Use Topics  . Alcohol use: Yes    Comment: socially  . Drug use: No     Allergies   Patient has no known allergies.   Review of Systems Review of Systems  Genitourinary: Negative for discharge, dysuria, hematuria, penile pain, penile swelling and testicular pain.         Right groin swelling     Physical Exam Updated Vital Signs BP 118/74 (BP Location: Right Arm)   Pulse 71   Temp 97.7 F (36.5 C)   Resp 18   SpO2 100%   Physical Exam  Constitutional: He appears well-developed and well-nourished.  HENT:  Head: Normocephalic and atraumatic.  Eyes: Conjunctivae and EOM are normal. Right eye exhibits no discharge. Left eye exhibits no discharge. No scleral icterus.  Pulmonary/Chest: Effort normal.  Abdominal: Hernia confirmed negative in the right inguinal area and confirmed negative in the left inguinal area.  Genitourinary: Testes normal and penis normal. Right testis shows no swelling and no tenderness. Left testis shows no swelling and no tenderness. Circumcised.  Genitourinary Comments: The exam was performed with a chaperone present. Normal male genitalia. No evidence of rash, ulcers or lesions. There is some swelling noted to the right inguinal fold.  No identifiable fluctuance.  No active drainage at this time.  No inguinal hernia noted. No abnormalities of the left groin.   Neurological: He is alert.  Skin: Skin is warm and dry.  Psychiatric: He has a normal mood and affect. His speech is normal and behavior is normal.  Nursing note and vitals reviewed.    ED Treatments / Results  Labs (all labs ordered  are listed, but only abnormal results are displayed) Labs Reviewed - No data to display  EKG  EKG Interpretation None       Radiology No results found.  Procedures Procedures (including critical care time)  Medications Ordered in ED Medications  acetaminophen (TYLENOL) tablet 1,000 mg (1,000 mg Oral Given 11/17/17 2357)     Initial Impression / Assessment and Plan / ED Course  I have reviewed the triage vital signs and the nursing notes.  Pertinent labs & imaging results that were available during my care of the patient were reviewed by me and considered in my medical decision making (see chart for details).       22 y.o. male who presents for evaluation of swelling to speak.  Patient states that he had some drainage from the area.  No fevers.  Currently sexually active with one partner and states that the use protection.  Patient seen by urgent care today and prompted to come to the emergency department for further evaluation. Patient is afebrile, non-toxic appearing, sitting comfortably on examination table. Vital signs reviewed and stable.  On exam, there is no evidence of epididymitis or orchitis.  Testicles are without tenderness or swelling.  History/physical exam is not concerning for testicular torsion.  No swelling noted to the left groin.  No identifiable abscess with no fluctuance.  Discussed with Dr. Preston Fleeting who independently evaluated patient.  Concerned that this may be early abscess versus lymphgranuloma venereum.  Plan to treat with antibiotic therapy.  Will give urology follow-up.  Discussed plan with patient.  He is agreeable. Patient had ample opportunity for questions and discussion. All patient's questions were answered with full understanding. Strict return precautions discussed. Patient expresses understanding and agreement to plan.     Final Clinical Impressions(s) / ED Diagnoses   Final diagnoses:  Groin swelling    ED Discharge Orders        Ordered    doxycycline (VIBRAMYCIN) 100 MG capsule  2 times daily     11/18/17 0005       Maxwell Caul, PA-C 11/18/17 Azzie Glatter, MD 11/18/17 [phone]

## 2017-11-17 NOTE — Discharge Instructions (Signed)
Please go to emergency room for further evaluation and imaging.

## 2017-11-17 NOTE — ED Provider Notes (Signed)
[facility]    CSN: [id number] Arrival date & time: 11/17/17  [id number]     History   Chief Complaint Chief Complaint  Patient presents with  . Groin Pain    HPI Kenneth Browning is a 22 y.o. male presenting with right groin/inguinal swelling and pain x 1 week. Concerned about a hernia. Feels area has grown in size slightly over the week. Denies urinary symptoms of dysuria, increased frequency. Does endorse drainage, denies penile discharge.  Denies any bulges, denies any testicular or scrotal swelling. No fevers. No rashes or lesions.   HPI  Past Medical History:  Diagnosis Date  . Migraine     Patient Active Problem List   Diagnosis Date Noted  . Migraine 12/29/2014    Past Surgical History:  Procedure Laterality Date  . EYE SURGERY     LT eye surgery       Home Medications    Prior to Admission medications   Medication Sig Start Date End Date Taking? Authorizing Provider  naproxen (NAPROSYN) 500 MG tablet Take 1 tablet (500 mg total) by mouth 2 (two) times daily with a meal. 11/30/14   Ethelda ChickSmith, Kristi M, MD  Vitamin D, Ergocalciferol, (DRISDOL) 50000 UNITS CAPS capsule Take 1 capsule (50,000 Units total) by mouth every 7 (seven) days. 12/30/14   Quentin Mullingollier, Amanda, PA-C    Family History Family History  Problem Relation Age of Onset  . Cancer Maternal Grandfather   . Hypertension Mother     Social History Social History   Tobacco Use  . Smoking status: Never Smoker  . Smokeless tobacco: Never Used  Substance Use Topics  . Alcohol use: Yes    Comment: socially  . Drug use: No     Allergies   Patient has no known allergies.   Review of Systems Review of Systems  Gastrointestinal: Negative for abdominal pain, nausea and vomiting.  Genitourinary: Negative for discharge, dysuria, frequency, genital sores, hematuria, penile pain, penile swelling and scrotal swelling.       Inguinal swelling.  Neurological: Negative for dizziness,  light-headedness and headaches.     Physical Exam Triage Vital Signs ED Triage Vitals [11/17/17 1917]  Enc Vitals Group     BP (!) 146/74     Pulse Rate 65     Resp 16     Temp 98.6 F (37 C)     Temp Source Oral     SpO2 100 %     Weight 200 lb (90.7 kg)     Height 6\' 2"  (1.88 m)     Head Circumference      Peak Flow      Pain Score 7     Pain Loc      Pain Edu?      Excl. in GC?    No data found.  Updated Vital Signs BP (!) 146/74   Pulse 65   Temp 98.6 F (37 C) (Oral)   Resp 16   Ht 6\' 2"  (1.88 m)   Wt 200 lb (90.7 kg)   SpO2 100%   BMI 25.68 kg/m   Physical Exam  Constitutional: He appears well-developed and well-nourished.  HENT:  Head: Normocephalic and atraumatic.  Eyes: Conjunctivae are normal.  Neck: Neck supple.  Cardiovascular: Normal rate and regular rhythm.  No murmur heard. Pulmonary/Chest: Effort normal and breath sounds normal. No respiratory distress.  Abdominal: Soft. There is no tenderness.  Genitourinary:  Genitourinary Comments: Induration and swelling to right inguinal/groin area.  Tender to palpation. No drainage expressed. No inguinal hernias palpated on exam with straining.   Musculoskeletal: He exhibits no edema.  Neurological: He is alert.  Skin: Skin is warm and dry.  Psychiatric: He has a normal mood and affect.  Nursing note and vitals reviewed.    UC Treatments / Results  Labs (all labs ordered are listed, but only abnormal results are displayed) Labs Reviewed - No data to display  EKG  EKG Interpretation None       Radiology No results found.  Procedures Procedures (including critical care time)  Medications Ordered in UC Medications - No data to display   Initial Impression / Assessment and Plan / UC Course  I have reviewed the triage vital signs and the nursing notes.  Pertinent labs & imaging results that were available during my care of the patient were reviewed by me and considered in my medical  decision making (see chart for details).     Given location and size of swelling, possible abscess, advised to go to the emergency room for further imaging and possible consult with urology. Unlikely torsion given length of symptoms. Lack of urinary symptoms/discharge STD/epididmitis/uti unlikely. Patient stable.   Final Clinical Impressions(s) / UC Diagnoses   Final diagnoses:  Groin abscess    ED Discharge Orders    None       Controlled Substance Prescriptions [place] [facility] [place] Controlled Substance Registry consulted? Not Applicable   Lew Dawes, [place] 11/17/17 2009

## 2017-11-18 MED ORDER — DOXYCYCLINE HYCLATE 100 MG PO CAPS: 100.0000 mg | ORAL_CAPSULE | Freq: Two times a day (BID) | ORAL | 0 refills | Status: AC

## 2017-11-18 NOTE — Discharge Instructions (Signed)
Take antibiotics as directed. Please take all of your antibiotics until finished.  You can take Tylenol or Ibuprofen as directed for pain. You can alternate Tylenol and Ibuprofen every 4 hours. If you take Tylenol at 1pm, then you can take Ibuprofen at 5pm. Then you can take Tylenol again at 9pm.   As we discussed, apply warm compresses to the area.  Follow-up with referred urologist for further region.  Return to the Emergency Department for any worsening pain, redness or swelling of the area, fevers or any other meds.
